# Patient Record
Sex: Male | Born: 1970 | Race: Asian | Hispanic: No | Marital: Single | Smoking: Former smoker
Health system: Southern US, Community
[De-identification: ages and names within clinical notes are randomized; demographics above are authoritative.]

## PROBLEM LIST (undated history)

## (undated) DIAGNOSIS — I10 Essential (primary) hypertension: Secondary | ICD-10-CM

## (undated) DIAGNOSIS — I219 Acute myocardial infarction, unspecified: Secondary | ICD-10-CM

## (undated) HISTORY — PX: CARDIAC SURGERY: SHX584

---

## 2016-05-28 ENCOUNTER — Emergency Department: Payer: Medicaid - Out of State

## 2016-05-28 ENCOUNTER — Emergency Department
Admission: EM | Admit: 2016-05-28 | Discharge: 2016-05-28 | Disposition: A | Discharge status: Home / Self Care | Payer: Medicaid - Out of State | Attending: Emergency Medicine | Admitting: Emergency Medicine

## 2016-05-28 ENCOUNTER — Encounter: Payer: Self-pay | Admitting: Emergency Medicine

## 2016-05-28 DIAGNOSIS — R041 Hemorrhage from throat: Secondary | ICD-10-CM | POA: Diagnosis not present

## 2016-05-28 DIAGNOSIS — J358 Other chronic diseases of tonsils and adenoids: Secondary | ICD-10-CM

## 2016-05-28 DIAGNOSIS — I10 Essential (primary) hypertension: Secondary | ICD-10-CM | POA: Insufficient documentation

## 2016-05-28 DIAGNOSIS — R040 Epistaxis: Secondary | ICD-10-CM | POA: Diagnosis present

## 2016-05-28 DIAGNOSIS — Z87891 Personal history of nicotine dependence: Secondary | ICD-10-CM | POA: Diagnosis not present

## 2016-05-28 DIAGNOSIS — Z7982 Long term (current) use of aspirin: Secondary | ICD-10-CM | POA: Insufficient documentation

## 2016-05-28 DIAGNOSIS — J029 Acute pharyngitis, unspecified: Secondary | ICD-10-CM

## 2016-05-28 HISTORY — DX: Essential (primary) hypertension: I10

## 2016-05-28 HISTORY — DX: Acute myocardial infarction, unspecified: I21.9

## 2016-05-28 LAB — COMPREHENSIVE METABOLIC PANEL
ALK PHOS: 51 U/L (ref 38–126)
ALT: 37 U/L (ref 17–63)
AST: 40 U/L (ref 15–41)
Albumin: 5.3 g/dL — ABNORMAL HIGH (ref 3.5–5.0)
Anion gap: 10 (ref 5–15)
BILIRUBIN TOTAL: 1.1 mg/dL (ref 0.3–1.2)
BUN: 23 mg/dL — AB (ref 6–20)
CALCIUM: 9.7 mg/dL (ref 8.9–10.3)
CO2: 24 mmol/L (ref 22–32)
CREATININE: 0.6 mg/dL — AB (ref 0.61–1.24)
Chloride: 103 mmol/L (ref 101–111)
GFR calc Af Amer: >60 mL/min (ref >60)
Glucose, Bld: 135 mg/dL — ABNORMAL HIGH (ref 65–99)
Potassium: 3.9 mmol/L (ref 3.5–5.1)
Sodium: 137 mmol/L (ref 135–145)
TOTAL PROTEIN: 8.6 g/dL — AB (ref 6.5–8.1)

## 2016-05-28 LAB — CBC WITH DIFFERENTIAL/PLATELET
BASOS ABS: 0 10*3/uL (ref 0–0.1)
Basophils Relative: 1 %
EOS ABS: 0.1 10*3/uL (ref 0–0.7)
EOS PCT: 2 %
HEMATOCRIT: 48.1 % (ref 40.0–52.0)
Hemoglobin: 16.1 g/dL (ref 13.0–18.0)
LYMPHS ABS: 3.1 10*3/uL (ref 1.0–3.6)
LYMPHS PCT: 39 %
MCH: 29 pg (ref 26.0–34.0)
MCHC: 33.5 g/dL (ref 32.0–36.0)
MCV: 86.5 fL (ref 80.0–100.0)
MONO ABS: 0.6 10*3/uL (ref 0.2–1.0)
Monocytes Relative: 8 %
Neutro Abs: 4 10*3/uL (ref 1.4–6.5)
Neutrophils Relative %: 50 %
Platelets: 245 10*3/uL (ref 150–440)
RBC: 5.56 MIL/uL (ref 4.40–5.90)
RDW: 13.3 % (ref 11.5–14.5)
WBC: 7.9 10*3/uL (ref 3.8–10.6)

## 2016-05-28 LAB — APTT: aPTT: 34 seconds (ref 24–36)

## 2016-05-28 LAB — PROTIME-INR
INR: 0.88
Prothrombin Time: 11.9 seconds (ref 11.4–15.2)

## 2016-05-28 LAB — TYPE AND SCREEN
ABO/RH(D): AB POS
Antibody Screen: NEGATIVE

## 2016-05-28 MED ORDER — CLONIDINE HCL 0.1 MG PO TABS
0.1000 mg | ORAL_TABLET | Freq: Once | ORAL | Status: AC
  Administered 2016-05-28: 0.1 mg via ORAL
  Filled 2016-05-28: qty 1

## 2016-05-28 MED ORDER — IOPAMIDOL (ISOVUE-300) INJECTION 61%
75.0000 mL | Freq: Once | INTRAVENOUS | Status: AC | PRN
  Administered 2016-05-28: 75 mL via INTRAVENOUS

## 2016-05-28 MED ORDER — OXYMETAZOLINE HCL 0.05 % NA SOLN
1.0000 | Freq: Once | NASAL | Status: AC
  Administered 2016-05-28: 1 via NASAL
  Filled 2016-05-28: qty 15

## 2016-05-28 MED ORDER — HYDRALAZINE HCL 20 MG/ML IJ SOLN: 10.0000 mg | Freq: Four times a day (QID) | INTRAMUSCULAR | Status: DC | PRN

## 2016-05-28 MED ORDER — AMOXICILLIN-POT CLAVULANATE 875-125 MG PO TABS: 1.0000 | ORAL_TABLET | Freq: Two times a day (BID) | ORAL | 0 refills | Status: AC

## 2016-05-28 MED ORDER — METOPROLOL TARTRATE 25 MG PO TABS
25.0000 mg | ORAL_TABLET | Freq: Once | ORAL | Status: AC
  Administered 2016-05-28: 25 mg via ORAL
  Filled 2016-05-28: qty 1

## 2016-05-28 MED ORDER — BENZOCAINE 20 % MT SOLN
Freq: Once | OROMUCOSAL | Status: AC
  Administered 2016-05-28: 1 via OROMUCOSAL
  Filled 2016-05-28: qty 10

## 2016-05-28 MED ORDER — AMLODIPINE BESYLATE 10 MG PO TABS: 10.0000 mg | ORAL_TABLET | Freq: Every day | ORAL | 0 refills | Status: AC

## 2016-05-28 MED ORDER — AMLODIPINE BESYLATE 5 MG PO TABS
10.0000 mg | ORAL_TABLET | ORAL | Status: AC
  Administered 2016-05-28: 10 mg via ORAL
  Filled 2016-05-28: qty 2

## 2016-05-28 MED ORDER — TRANEXAMIC ACID 1000 MG/10ML IV SOLN
500.0000 mg | Freq: Once | INTRAVENOUS | Status: AC
  Administered 2016-05-28: 500 mg via TOPICAL
  Filled 2016-05-28: qty 10

## 2016-05-28 NOTE — ED Provider Notes (Signed)
[facility] Emergency Department Provider Note   ____________________________________________   First MD Initiated Contact with Patient 05/28/16 [phone]     (approximate)  I have reviewed the triage vital signs and the nursing notes.   HISTORY  Chief Complaint Epistaxis    HPI Scott Salazar is a 45 y.o. male who comes into the hospital today with some blood in his mouth. The patient reports he woke up this morning in [place]. He reports that he went to see his friend but spit and had some blood come out of his mouth. He reports that he didn't think much of it and got on a plane and came to [place]. He reports that when he was visiting his friend he was spitting again and noticed more blood coming out of his mouth. He reports that the bleeding from the mouth occurred a third time. The patient has not had new bleeding from his nares but feels blood going down the back of his throat. He reports that since he had his heart surgery he's had episodes with nasal bleeding. He reports that at one point he had to have something put into his nose and then it was taken out a few days later. The patient reports that he missed his blood pressure medicine today. He is concerned that he keeps spitting out blood so he decided to come in and get checked out.   Past Medical History:  Diagnosis Date  . Hypertension   . MI (myocardial infarction) (HCC)     There are no active problems to display for this patient.   Past Surgical History:  Procedure Laterality Date  . CARDIAC SURGERY      Prior to Admission medications   Not on File    Allergies Review of patient's allergies indicates no known allergies.  No family history on file.  Social History Social History  Substance Use Topics  . Smoking status: Former Games developermoker  . Smokeless tobacco: Never Used  . Alcohol use No    Review of Systems Constitutional: No fever/chills Eyes: No visual changes. ENT:  Nasal bleeding Cardiovascular: Denies chest pain. Respiratory: Denies shortness of breath. Gastrointestinal: No abdominal pain.  No nausea, no vomiting.  No diarrhea.  No constipation. Genitourinary: Negative for dysuria. Musculoskeletal: Negative for back pain. Skin: Negative for rash. Neurological: Negative for headaches, focal weakness or numbness.  10-point ROS otherwise negative.  ____________________________________________   PHYSICAL EXAM:  VITAL SIGNS: ED Triage Vitals  Enc Vitals Group     BP 05/28/16 0207 (!) 165/107     Pulse Rate 05/28/16 0205 87     Resp 05/28/16 0205 18     Temp 05/28/16 0205 98.2 F (36.8 C)     Temp Source 05/28/16 0205 Oral     SpO2 05/28/16 0205 96 %     Weight 05/28/16 0205 198 lb (89.8 kg)     Height 05/28/16 0205 5\' 7"  (1.702 m)     Head Circumference --      Peak Flow --      Pain Score 05/28/16 0220 0     Pain Loc --      Pain Edu? --      Excl. in GC? --     Constitutional: Alert and oriented. Well appearing and in no acute distress. Eyes: Conjunctivae are normal. PERRL. EOMI. Head: Atraumatic. Nose: No congestion/rhinnorhea. Mouth/Throat: Mucous membranes are moist.  Oropharynx non-erythematous. Blood noted to right tonsillar pillar Cardiovascular: Normal rate, regular rhythm. Grossly  normal heart sounds.  Good peripheral circulation. Respiratory: Normal respiratory effort.  No retractions. Lungs CTAB. Gastrointestinal: Soft and nontender. No distention. Positive bowel sounds Musculoskeletal: No lower extremity tenderness nor edema.   Neurologic:  Normal speech and language. No gross focal neurologic deficits are appreciated.  Skin:  Skin is warm, dry and intact.  Psychiatric: Mood and affect are normal.   ____________________________________________   LABS (all labs ordered are listed, but only abnormal results are displayed)  Labs Reviewed - No data to  display ____________________________________________  EKG  none ____________________________________________  RADIOLOGY  none ____________________________________________   PROCEDURES  Procedure(s) performed: None  Procedures  Critical Care performed: No  ____________________________________________   INITIAL IMPRESSION / ASSESSMENT AND PLAN / ED COURSE  Pertinent labs & imaging results that were available during my care of the patient were reviewed by me and considered in my medical decision making (see chart for details).  This is a 45 year old male who comes in with some spitting out blood. The patient feels that the blood is coming from his nostril. He does have some blood on his right tonsillar pillar. I will give the patient his dose of metoprolol as his blood pressures elevated may be contributing to the bleeding. I will also use Afrin to his bilateral nares to see if that will help with some of the bleeding. The patient be reassessed.  Clinical Course     The patient continues to have some bleeding. I did contact Dr. Willeen CassBennett who is in the the OR. We will use some TXA to attempt  to stop the bleeding and have the patient seen by Dr. Willeen CassBennett when he is finished with his OR case. The patient's care will be signed out to Dr. Inocencio HomesGayle who will reassess the patient.    ____________________________________________   FINAL CLINICAL IMPRESSION(S) / ED DIAGNOSES  Final diagnoses:  Bleeding from nasopharynx      NEW MEDICATIONS STARTED DURING THIS VISIT:  New Prescriptions   No medications on file     Note:  This document was prepared using Dragon voice recognition software and may include unintentional dictation errors.    Rebecka ApleyAllison P Webster, MD 05/28/16 [phone]

## 2016-05-28 NOTE — Progress Notes (Signed)
Patient ID: Scott Salazar, male   DOB: 11/24/1970, 45 y.o.   MRN: [id number] Discussed the patient by phone with Dr. Elpidio AnisSudini. BP has improved and sounds like bleeding has lessened, though still some noted on exam. I discussed my plan, which was for him to be observed this afternoon, addressing the hypertension, and if bleeding persisted, my next option would be taking him to the OR for control of the bleeding, which would most likely entail tonsillectomy. This has also been discussed at length with the patient. The patient had asked me about returning to [place] to have this done, and I pointed out the obvious risk of getting on a plane for an extended flight with this going on.   All of this noted, the patient now wants to be discharged and does not want to be taken to the OR. While not ideal, none of the bleeding has been profuse, and it sounds like it has slowed, with the potential to resolve spontaneously. I suggested he be covered with antibiotics (just in case there was any underlying infection that contributed) and will need to return to the ER if the bleeding worsens again. He should also stay off aspirin, and have additional coverage for his hypertension to insure better control.

## 2016-05-28 NOTE — ED Provider Notes (Signed)
-----------------------------------------   10:18 AM on 05/28/2016 -----------------------------------------  There was assumed by Dr. Zenda AlpersWebster 8:30 AM pending reassessment after Hurricaine spray and TXA. Patient had continued oozing from the tonsil. Dr. Willeen CassBennett has evaluated the patient, recommends medical admission for additional blood pressure management, if bleeding not improved after that, he will take the patient to operating room for tonsillectomy. He also requests CT soft-tissue neck which I have ordered.  ----------------------------------------- 1:59 PM on 05/28/2016 ----------------------------------------- Patient hasdecided that he wanted to leave, he does not one and be admitted to the hospital and does not want tonsillectomy at this time. His bleeding has slowed considerably. This was discussed with Dr. Willeen CassBennett who recommends discharge with Augmentin, and ENT follow-up. Dr. Elpidio AnisSudini of hospitalist service has consulted, has written a prescription for Norvasc for blood pressure control. Patient knows to return for any worsening symptoms. DC home.   CT neck IMPRESSION: Bilateral tonsil enlargement. This is likely due to pharyngitis. No focal mass lesion. Negative for abscess.  Scattered cervical spine lymph nodes bilaterally which are not pathologic. These are likely reactive nodes.  Chronic fracture left medial orbit.  Small mastoid effusion on the right.   Gayla DossEryka A Shontell Prosser, MD 05/28/16 [phone]

## 2016-05-28 NOTE — ED Notes (Signed)
Pt spit approx 5 mL of blood after taking the nasal spray

## 2016-05-28 NOTE — ED Notes (Signed)
Pt c/o of epistaxis beginning 8/15 at approx 10:00. Pt reports blood is draining down throat. Pt denies pain/injury.

## 2016-05-28 NOTE — Discharge Instructions (Signed)
You were seen in the emergency department from bleeding from your tonsil. Do not take aspirin for the next 5 days. Take Norvasc for blood pressure control. Follow-up with Dr. Willeen CassBennett of ENT in clinic. Return immediately to the emergency department if you develop any increase in the bleeding from your tonsil, any shortness of breath, any fevers, any chest pain, difficulty breathing or for any other concerns.

## 2016-05-28 NOTE — Consult Note (Addendum)
Scott Salazar, Scott Salazar [id number] 05/02/1971 Scott Salazar, Scott Salazar S, MD  Reason for Consult: Bleeding from the tonsil   Requesting Physician: ER  Consulting Physician: Scott Salazar, [name] S, MD  HPI: This 45 y.o. year old male was admitted on 05/28/2016 for Coughing Up Blood. He says this started yesterday as she traveled from [place] to [place]. He denies any preceding sore throat and had not had any bleeding from the nose at all. He does have hypertension, on blood pressure medication, and his blood pressure has been up since he arrived in the emergency room. As far as he knows his blood pressure has been previously well controlled on medication. He is also on aspirin a day. He has not had any difficulty swallowing. He denies any associated fever.  Patient does note he had a bleeding episode may 4 years ago which was treated with nasal packing, but no issue since.  Medications:  No current facility-administered medications for this encounter.    No current outpatient prescriptions on file.  .  (Not in a hospital admission)  Allergies: No Known Allergies  PMH:  Past Medical History:  Diagnosis Date  . Hypertension   . MI (myocardial infarction) (HCC)     Fam Hx: No family history on file.  Soc Hx:  Social History   Social History  . Marital status: Single    Spouse name: N/A  . Number of children: N/A  . Years of education: N/A   Occupational History  . Not on file.   Social History Main Topics  . Smoking status: Former Games developermoker  . Smokeless tobacco: Never Used  . Alcohol use No  . Drug use: No  . Sexual activity: Not on file   Other Topics Concern  . Not on file   Social History Narrative  . No narrative on file    PSH:  Past Surgical History:  Procedure Laterality Date  . CARDIAC SURGERY    . Procedures since admission: No admission procedures for hospital encounter.  ROS: Review of systems normal other than 12 systems except per HPI.  PHYSICAL EXAM  Vitals:  Blood pressure (!) 163/112, pulse 77, temperature 98 F (36.7 C), temperature source Oral, resp. rate 19, height 5\' 7"  (1.702 m), weight 89.8 kg (198 lb), SpO2 98 %.. General: Well-developed, Well-nourished in no acute distress Mood: Mood and affect well adjusted, pleasant and cooperative. Orientation: Grossly alert and oriented. Vocal Quality: No hoarseness. Communicates verbally. head and Face: NCAT. No facial asymmetry. No visible skin lesions. No significant facial scars. No tenderness with sinus percussion. Facial strength normal and symmetric. Ears: External ears with normal landmarks, no lesions. External auditory canals free of infection, cerumen impaction or lesions. Tympanic membranes intact with good landmarks and normal mobility on pneumatic otoscopy. No middle ear effusion. Hearing: Speech reception grossly normal. Nose: External nose normal with midline dorsum and no lesions or deformity. Nasal Cavity reveals essentially midline septum with normal inferior turbinates. No significant mucosal congestion or erythema. Nasal secretions are minimal and clear. No polyps seen on anterior rhinoscopy. There is no evidence of blood in the nasal cavity. Oral Cavity/ Oropharynx: Lips are normal with no lesions. Teeth no frank dental caries. Gingiva healthy with no lesions or gingivitis. The tongue, floor mouth and palate are unremarkable. There is blood staining in the pharynx, and with careful examination, slow bleeding can be traced to the right tonsil just under the anterior tonsillar pillar. The tonsils are 2+ in size and appear otherwise normal. There is no  evidence of infection or mass. Indirect Laryngoscopy/Nasopharyngoscopy: Visualization of the larynx, hypopharynx and nasopharynx is not possible in this setting with routine examination. Neck: Supple and symmetric with no palpable masses, tenderness or crepitance. The trachea is midline. Thyroid gland is soft, nontender and symmetric with no  masses or enlargement. Parotid and submandibular glands are soft, nontender and symmetric, without masses. He does have a large birthmark on the left lateral neck. Lymphatic: Cervical lymph nodes are without palpable lymphadenopathy or tenderness. Respiratory: Normal respiratory effort without labored breathing. Cardiovascular: Carotid pulse shows regular rate and rhythm Neurologic: Cranial Nerves II through XII are grossly intact. Eyes: Gaze and Ocular Motility are grossly normal. PERRLA. No visible nystagmus.  MEDICAL DECISION MAKING: Data Review: No results found for this or any previous visit (from the past 48 hour(s)).Marland Kitchen. No results found.Marland Kitchen.   PROCEDURE: Procedure: Diagnostic Fiberoptic Nasolaryngoscopy Diagnosis: Pharyngeal hemorrhage Indications: Bleeding from throat Findings:Prominent varicosities in nasopharynx, but no bleeding in the nasal cavity or nasopharynx. Minmal bleeding could be seen from the right tonsil area. No tongue base bleeding or mass. Hypopharynx and larynx unremarkable Description of Procedure: After discussing procedure and risks  (primarily nose bleed) with the patient, the nose was anesthetized with topical Lidocaine 4% and decongested with phenylephrine. A flexible fiberoptic scope was passed through the nasal cavity. The nasal cavity was inspected and the scope passed through the Nasopharynx to the region of the hypopharynx and larynx. The patient was instructed to phonate to assess vocal cord mobility. The tongue was extended to evaluate the tongue base completely. Valsalva was performed to insufflate the hypopharynx for improved examination. Findings are as noted above. The scope was withdrawn. The patient tolerated the procedure well.  ASSESSMENT: Unusual situation of bleeding from the right tonsil. The most common cause of this would be inflammatory change from an infection but the tonsil does not appear acutely infected. I do think it's significant that his blood  pressure is quite elevated however, and spontaneous bleeding from the nasal cavity is certainly common in association with hypertension, so conceivably this could be a spontaneous small vessel rupture due to his hypertension.  PLAN: I would recommend a CT scan of the neck to rule out any worrisome deep causes of pharyngeal bleeding including malignancy or carotid aneurysm. Observation with control of his blood pressure would be a reasonable initial step, however if the bleeding persists despite improvement of the blood pressure, and CT scan shows no other worrisome abnormality, tonsillectomy with control of hemorrhage could be performed. In the interim ice water gargles and ice packs to the neck and the used to slow the bleeding, which is fortunately mild at this point. CBC and coags should also be obtained.   Scott Salazar, Adonica Fukushima S, MD 05/28/2016 10:28 AM

## 2016-05-28 NOTE — ED Triage Notes (Signed)
Pt presents to ED with c/o "spitting out blood" since this morning. Pt states he has noticed blood in his saliva today that seems to be getting progressively worse. Hx of the same due to nose bleed. Pt states he can feel blood running down the back of his throat from his nose but it does not come out of his nose. Pt does take a baby aspirin daily. No known injury. No increased work of breathing noted at this time.

## 2016-05-28 NOTE — Consult Note (Signed)
[facility] [place] [facility]   PATIENT NAME: Scott FreesKhanh Lundstrom    MR#:  [id number]  DATE OF BIRTH:  09/05/1971  DATE OF ADMISSION:  05/28/2016  PRIMARY CARE PHYSICIAN: No PCP Per Patient   CONSULT REQUESTING/REFERRING PHYSICIAN: Dr. Inocencio HomesGayle  REASON FOR CONSULT: Consider bleeding with accelerated hypertension  CHIEF COMPLAINT:   Chief Complaint  Patient presents with  . Epistaxis    HISTORY OF PRESENT ILLNESS:  Scott FreesKhanh Lahmann  is a 45 y.o. 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 Patient has not had epistaxis or melena or hematuria. No recent change in medications. Here he has been found to have accelerated hypertension. Was seen by Dr. Willeen CassBennett of ENT and on examination has right tonsillar bleeding with no other significant erythema. Vitals have been stable other than the elevated blood pressure. CT scan of the neck was checked which showed possible bilateral pharyngitis with tonsillar enlargement. No abscess. Nothing acute.  Patient is wanting to go home.  PAST MEDICAL HISTORY:   Past Medical History:  Diagnosis Date  . Hypertension   . MI (myocardial infarction) (HCC)     PAST SURGICAL HISTOIRY:   Past Surgical History:  Procedure Laterality Date  . CARDIAC SURGERY      SOCIAL HISTORY:   Social History  Substance Use Topics  . Smoking status: Former Games developermoker  . Smokeless tobacco: Never Used  . Alcohol use No    FAMILY HISTORY:  No family history on file.    No family history of CAD  DRUG ALLERGIES:  No Known Allergies  REVIEW OF SYSTEMS:   ROS  CONSTITUTIONAL: No fever, fatigue or weakness.  EYES: No blurred or double vision.  EARS, NOSE, AND THROAT: No tinnitus or ear pain. Oral bleeding RESPIRATORY: No cough, shortness of breath, wheezing or hemoptysis.  CARDIOVASCULAR: No chest pain, orthopnea, edema.   GASTROINTESTINAL: No nausea, vomiting, diarrhea or abdominal pain.  GENITOURINARY: No dysuria, hematuria.  ENDOCRINE: No polyuria, nocturia,  HEMATOLOGY: No anemia, easy bruising SKIN: No rash or lesion. MUSCULOSKELETAL: No joint pain or arthritis.   NEUROLOGIC: No tingling, numbness, weakness.  PSYCHIATRY: No anxiety or depression.   MEDICATIONS AT HOME:   Prior to Admission medications   Medication Sig Start Date End Date Taking? Authorizing Provider  aspirin 81 MG EC tablet Take 1 tablet by mouth daily. 05/03/16  Yes Historical Provider, MD  metoprolol tartrate (LOPRESSOR) 25 MG tablet Take 1 tablet by mouth 2 (two) times daily. 05/09/16  Yes Historical Provider, MD  simvastatin (ZOCOR) 20 MG tablet Take 1 tablet by mouth daily. 05/06/16  Yes Historical Provider, MD  amLODipine (NORVASC) 10 MG tablet Take 1 tablet (10 mg total) by mouth daily. 05/29/16   Elianie Hubers, MD      VITAL SIGNS:  Blood pressure (!) 140/95, pulse 78, temperature 98 F (36.7 C), temperature source Oral, resp. rate 17, height 5\' 7"  (1.702 m), weight 89.8 kg (198 lb), SpO2 99 %.  PHYSICAL EXAMINATION:  GENERAL:  45 y.o.-year-old patient lying in the bed with no acute distress.  EYES: Pupils equal, round, reactive to light and accommodation. No scleral icterus. Extraocular muscles intact.  HEENT: Head atraumatic, normocephalic. Oropharynx and nasopharynx clear. Right tonsillar blood clot. NECK:  Supple, no jugular venous distention. No thyroid enlargement, no tenderness.  LUNGS: Normal breath sounds bilaterally, no wheezing, rales,rhonchi or crepitation. No use  of accessory muscles of respiration.  CARDIOVASCULAR: S1, S2 normal. No murmurs, rubs, or gallops.  ABDOMEN: Soft, nontender, nondistended. Bowel sounds present. No organomegaly or mass.  EXTREMITIES: No pedal edema, cyanosis, or clubbing.  NEUROLOGIC: Cranial nerves II through XII are intact. Muscle strength 5/5 in all extremities. Sensation intact.  Gait not checked.  PSYCHIATRIC: The patient is alert and oriented x 3.  SKIN: No obvious rash, lesion, or ulcer.   LABORATORY PANEL:   CBC  Recent Labs Lab 05/28/16 1058  WBC 7.9  HGB 16.1  HCT 48.1  PLT 245   ------------------------------------------------------------------------------------------------------------------  Chemistries   Recent Labs Lab 05/28/16 1058  NA 137  K 3.9  CL 103  CO2 24  GLUCOSE 135*  BUN 23*  CREATININE 0.60*  CALCIUM 9.7  AST 40  ALT 37  ALKPHOS 51  BILITOT 1.1   ------------------------------------------------------------------------------------------------------------------  Cardiac Enzymes No results for input(s): TROPONINI in the last 168 hours. ------------------------------------------------------------------------------------------------------------------  RADIOLOGY:  Ct Soft Tissue Neck W Contrast  Result Date: 05/28/2016 CLINICAL DATA:  Tonsillar bleeding. EXAM: CT NECK WITH CONTRAST TECHNIQUE: Multidetector CT imaging of the neck was performed using the standard protocol following the bolus administration of intravenous contrast. CONTRAST:  75mL ISOVUE-300 IOPAMIDOL (ISOVUE-300) INJECTION 61% COMPARISON:  None. FINDINGS: Pharynx and larynx: Tonsils are enlarged bilaterally without focal mass. No abscess. Tongue and oral pharynx normal. Normal vocal cords. Salivary glands: Parotid and submandibular glands normal bilaterally. No mass or stone. Thyroid: Negative Lymph nodes: No pathologic adenopathy. Scattered small lymph nodes bilaterally. Posterior upper lymph node on the left measures 9 mm . 11 mm right level 2 lymph node. Vascular: Carotid artery patent bilaterally with mild atherosclerotic disease. Jugular vein patent bilaterally. Limited intracranial: Negative Visualized orbits: Chronic fracture left medial orbit. Paranasal sinuses clear. Mastoids and visualized paranasal sinuses: Paranasal sinuses clear. Partial opacification of  right mastoid sinus due to effusion. No obstructing nasopharyngeal mass. Skeleton: Prior CABG.  No acute skeletal abnormality. Upper chest: Lung apices clear. IMPRESSION: Bilateral tonsil enlargement. This is likely due to pharyngitis. No focal mass lesion. Negative for abscess. Scattered cervical spine lymph nodes bilaterally which are not pathologic. These are likely reactive nodes. Chronic fracture left medial orbit. Small mastoid effusion on the right. Electronically Signed   By: Marlan Palau M.D.   On: 05/28/2016 12:42    EKG:  No orders found for this or any previous visit.  IMPRESSION AND PLAN:   * Right tonsillar bleeding - Likely due to accelerated hypertension Blood pressures improved at this time. Observation overnight in the hospital offered to patient but he feels that his breathing is much improved and would like to go home. Discussed with ENT Dr. Willeen Cass.  Recommended antibiotics and discharge. CT scan shows nothing acute other than mildly swollen tonsils. No dysphagia or shortness of breath. Advised patient to hold aspirin for 3 days and restart her bleeding has stopped. He understands that he has to call Dr. Willeen Cass if his bleeding restarts or return to emergency room.  * Accelerated hypertension Continue metoprolol. We will add Norvasc.  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 Continue metoprolol and statin.  Discussed with Dr. Inocencio Homes of emergency room.  All the records are reviewed and case discussed with Consulting provider. Management plans discussed with the patient, family and they are in agreement.  TOTAL TIME TAKING CARE OF THIS PATIENT: 45 minutes.   Milagros Loll R M.D on 05/28/2016 at  1:00 PM  Between 7am to 6pm - Pager - [phone]  After 6pm go to [email or web address] - password [email or web address]  [facility]  Office  [phone]  CC: Primary care Physician: No PCP Per Patient  Note: This  dictation was prepared with Dragon dictation along with smaller phrase technology. Any transcriptional errors that result from this process are unintentional.

## 2016-05-28 NOTE — ED Notes (Signed)
Pt reports he normally takes BP meds. PT reports he did not take BP meds before coming to ED

## 2017-11-10 IMAGING — CT CT NECK W/ CM
4 of 5 series · 15 of 33 positions shown, 17 images · IV contrast (iopamidol)
Comparison: None.

CLINICAL DATA: Tonsillar bleeding.

EXAM:
CT NECK WITH CONTRAST
TECHNIQUE: Multidetector CT imaging of the neck was performed using the
standard protocol following the bolus administration of intravenous
contrast.
CONTRAST:  75mL 6NC2D7-ZEE IOPAMIDOL (6NC2D7-ZEE) INJECTION 61%

[Series 2: axial neck · axial · 0.59mm/px · z∈[-264,-162]mm · 3 of 128 slices shown]
[im 26/128  bone]
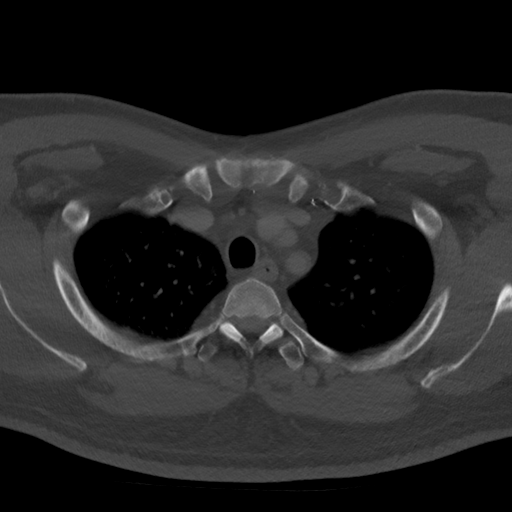
[im 51/128  bone]
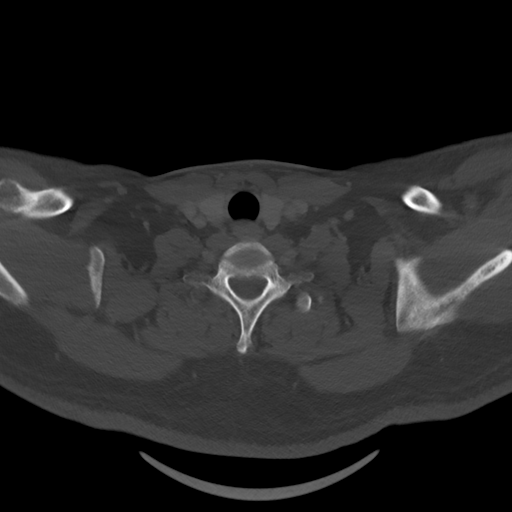
[im 77/128  bone]
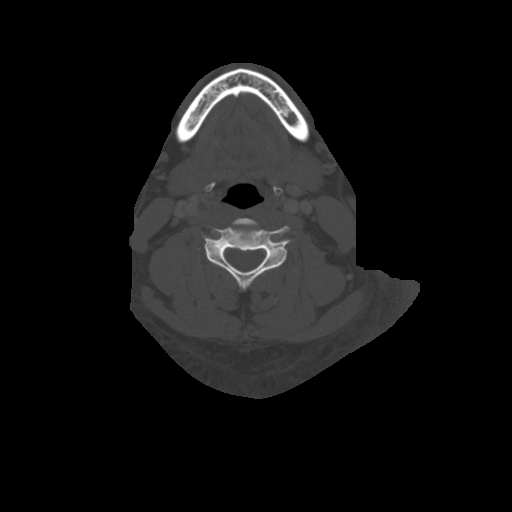

[Series 5: sag neck · sagittal · 0.50mm/px · 5 of 147 slices shown, 6 images]
[im 49/147  bone]
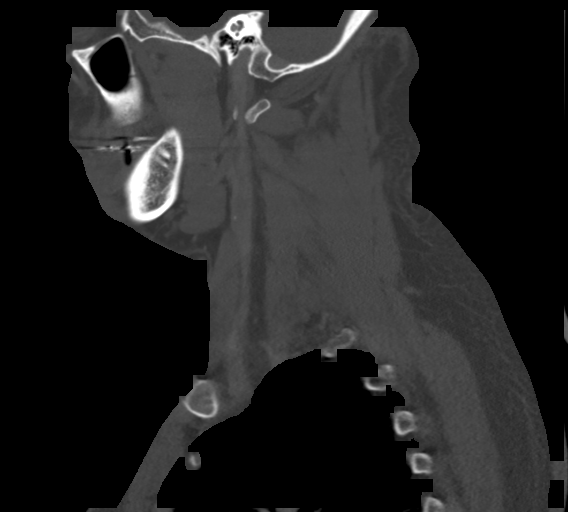
[im 61/147  bone]
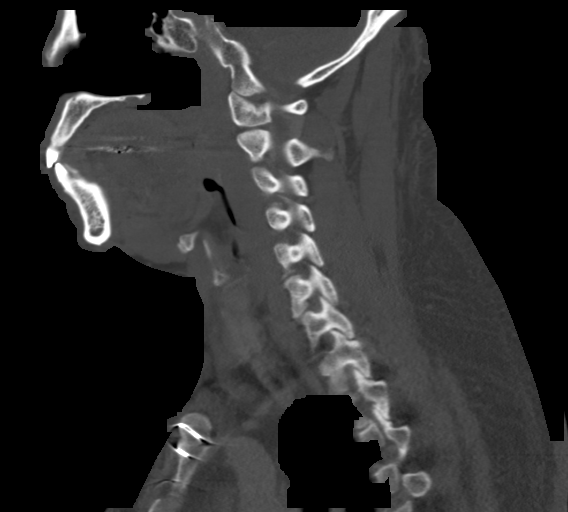
[im 74/147  soft-tissue]
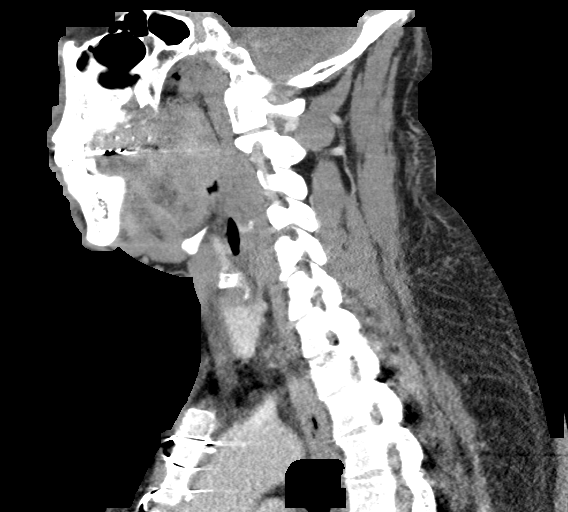
[im 74/147  bone]
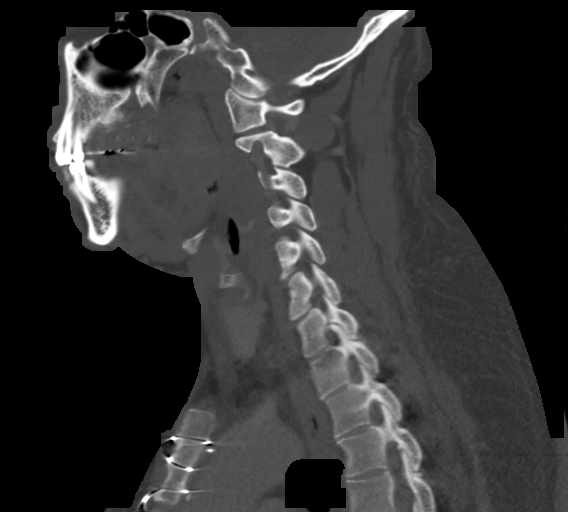
[im 86/147  bone]
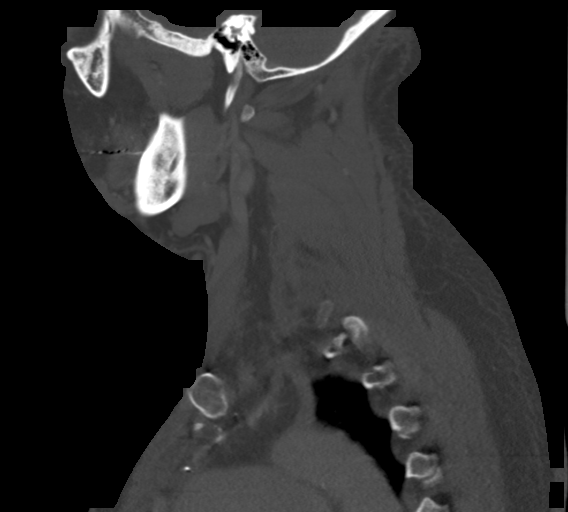
[im 98/147  bone]
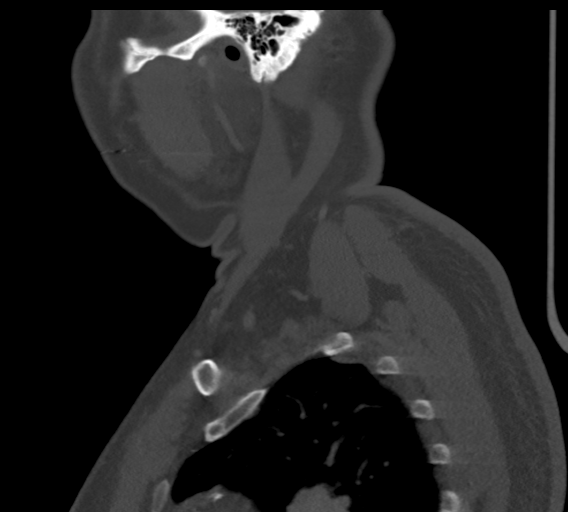

[Series 6: cor neck · coronal · 0.50mm/px · 3 of 108 slices shown]
[im 25/108  bone]
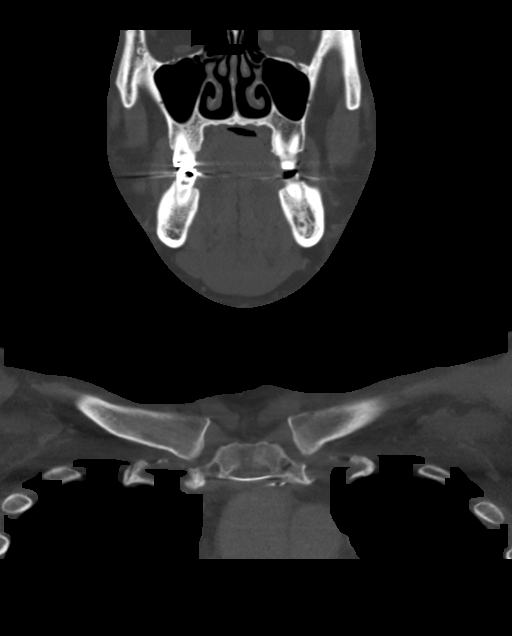
[im 44/108  bone]
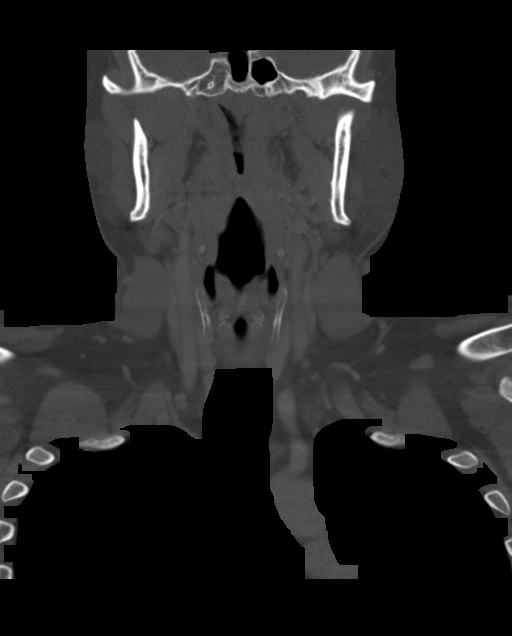
[im 64/108  bone]
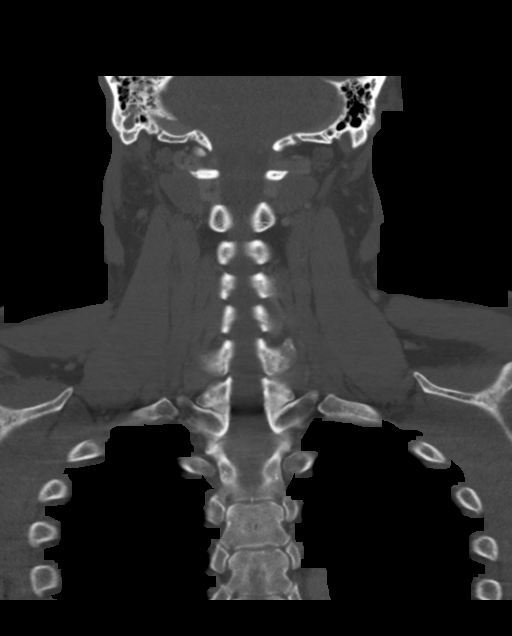

[Series 7: orthogonal ax · axial · 0.46mm/px · z∈[-298,-128]mm · 4 of 146 slices shown, 5 images]
[im 30/146  soft-tissue]
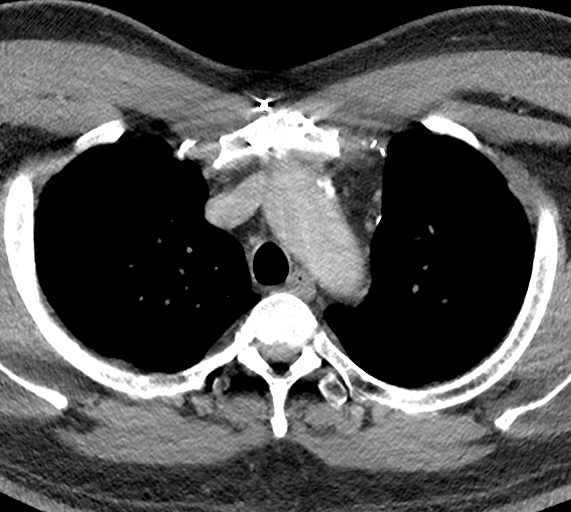
[im 30/146  bone]
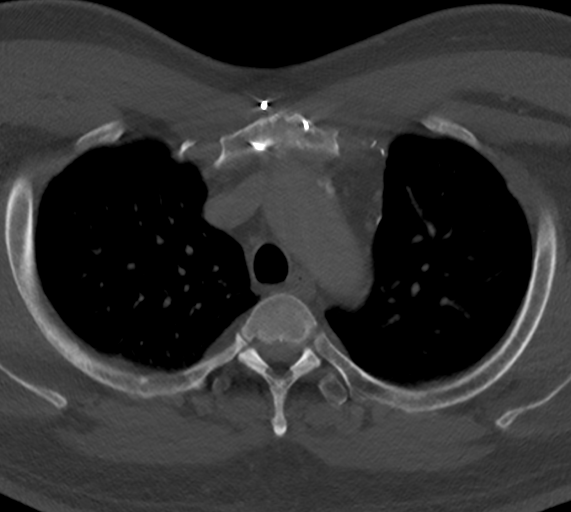
[im 59/146  bone]
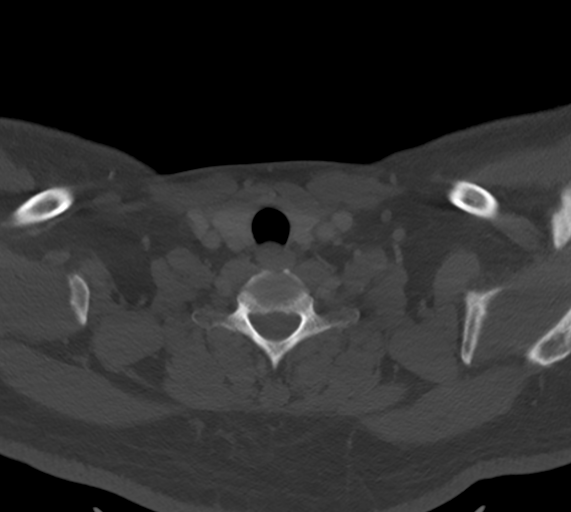
[im 88/146  bone]
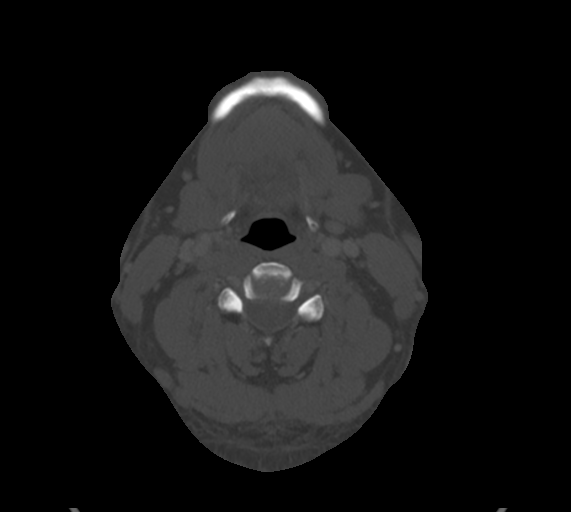
[im 117/146  bone]
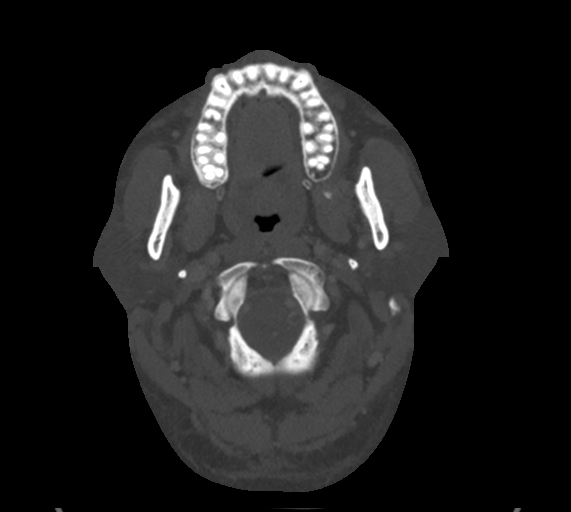

[15 of 33 positions shown; findings below may reference images not displayed]

FINDINGS: Pharynx and larynx: Tonsils are enlarged bilaterally without focal
mass. No abscess. Tongue and oral pharynx normal. Normal vocal
cords.

Salivary glands: Parotid and submandibular glands normal
bilaterally. No mass or stone.

Thyroid: Negative

Lymph nodes: No pathologic adenopathy. Scattered small lymph nodes
bilaterally. Posterior upper lymph node on the left measures 9 mm .
11 mm right level 2 lymph node.

Vascular: Carotid artery patent bilaterally with mild
atherosclerotic disease. Jugular vein patent bilaterally.

Limited intracranial: Negative

Visualized orbits: Chronic fracture left medial orbit. Paranasal
sinuses clear.

Mastoids and visualized paranasal sinuses: Paranasal sinuses clear.
Partial opacification of right mastoid sinus due to effusion. No
obstructing nasopharyngeal mass.

Skeleton: Prior CABG.  No acute skeletal abnormality.

Upper chest: Lung apices clear.
IMPRESSION: Bilateral tonsil enlargement. This is likely due to pharyngitis. No
focal mass lesion. Negative for abscess.

Scattered cervical spine lymph nodes bilaterally which are not
pathologic. These are likely reactive nodes.

Chronic fracture left medial orbit.

Small mastoid effusion on the right.
